# Patient Record
Sex: Female | Born: 1970 | Race: White | Hispanic: No | Marital: Single | Smoking: Never smoker
Health system: Southern US, Community
[De-identification: ages and names within clinical notes are randomized; demographics above are authoritative.]

## PROBLEM LIST (undated history)

## (undated) DIAGNOSIS — J302 Other seasonal allergic rhinitis: Secondary | ICD-10-CM

## (undated) DIAGNOSIS — M1990 Unspecified osteoarthritis, unspecified site: Secondary | ICD-10-CM

## (undated) DIAGNOSIS — E119 Type 2 diabetes mellitus without complications: Secondary | ICD-10-CM

## (undated) HISTORY — PX: CLEFT PALATE REPAIR: SUR1165

## (undated) HISTORY — PX: CLEFT LIP REPAIR: SUR1164

---

## 2009-07-16 ENCOUNTER — Encounter: Admission: RE | Admit: 2009-07-16 | Discharge: 2009-07-16 | Payer: Self-pay | Admitting: Unknown Physician Specialty

## 2010-03-29 ENCOUNTER — Ambulatory Visit
Admission: RE | Admit: 2010-03-29 | Discharge: 2010-03-29 | Disposition: A | Discharge status: Home / Self Care | Payer: MEDICARE | Source: Ambulatory Visit

## 2010-03-29 ENCOUNTER — Other Ambulatory Visit: Payer: Self-pay

## 2010-03-29 ENCOUNTER — Other Ambulatory Visit: Payer: Self-pay | Admitting: Unknown Physician Specialty

## 2010-03-29 DIAGNOSIS — M25561 Pain in right knee: Secondary | ICD-10-CM

## 2010-04-19 ENCOUNTER — Ambulatory Visit: Payer: MEDICARE | Attending: Sports Medicine | Admitting: Physical Therapy

## 2010-04-19 DIAGNOSIS — M25569 Pain in unspecified knee: Secondary | ICD-10-CM | POA: Insufficient documentation

## 2010-04-19 DIAGNOSIS — M6281 Muscle weakness (generalized): Secondary | ICD-10-CM | POA: Insufficient documentation

## 2010-04-19 DIAGNOSIS — IMO0001 Reserved for inherently not codable concepts without codable children: Secondary | ICD-10-CM | POA: Insufficient documentation

## 2010-04-26 ENCOUNTER — Ambulatory Visit: Payer: MEDICARE | Admitting: Physical Therapy

## 2010-05-03 ENCOUNTER — Ambulatory Visit: Payer: MEDICARE | Admitting: Physical Therapy

## 2010-05-20 ENCOUNTER — Ambulatory Visit: Payer: MEDICARE | Attending: Sports Medicine | Admitting: Physical Therapy

## 2010-05-20 DIAGNOSIS — IMO0001 Reserved for inherently not codable concepts without codable children: Secondary | ICD-10-CM | POA: Insufficient documentation

## 2010-05-20 DIAGNOSIS — M6281 Muscle weakness (generalized): Secondary | ICD-10-CM | POA: Insufficient documentation

## 2010-05-20 DIAGNOSIS — M25569 Pain in unspecified knee: Secondary | ICD-10-CM | POA: Insufficient documentation

## 2010-05-25 ENCOUNTER — Ambulatory Visit: Payer: MEDICARE | Admitting: Physical Therapy

## 2010-05-28 ENCOUNTER — Ambulatory Visit: Payer: MEDICARE | Admitting: Physical Therapy

## 2010-06-01 ENCOUNTER — Ambulatory Visit: Payer: MEDICARE | Admitting: Physical Therapy

## 2011-09-19 IMAGING — CR DG ABDOMEN 2V
2 series · 2 of 2 positions shown · non-contrast
Comparison: None

CLINICAL DATA: Abdominal pain.  Question constipation.

ABDOMEN - 2 VIEW

[view not recorded (1 of 2)]
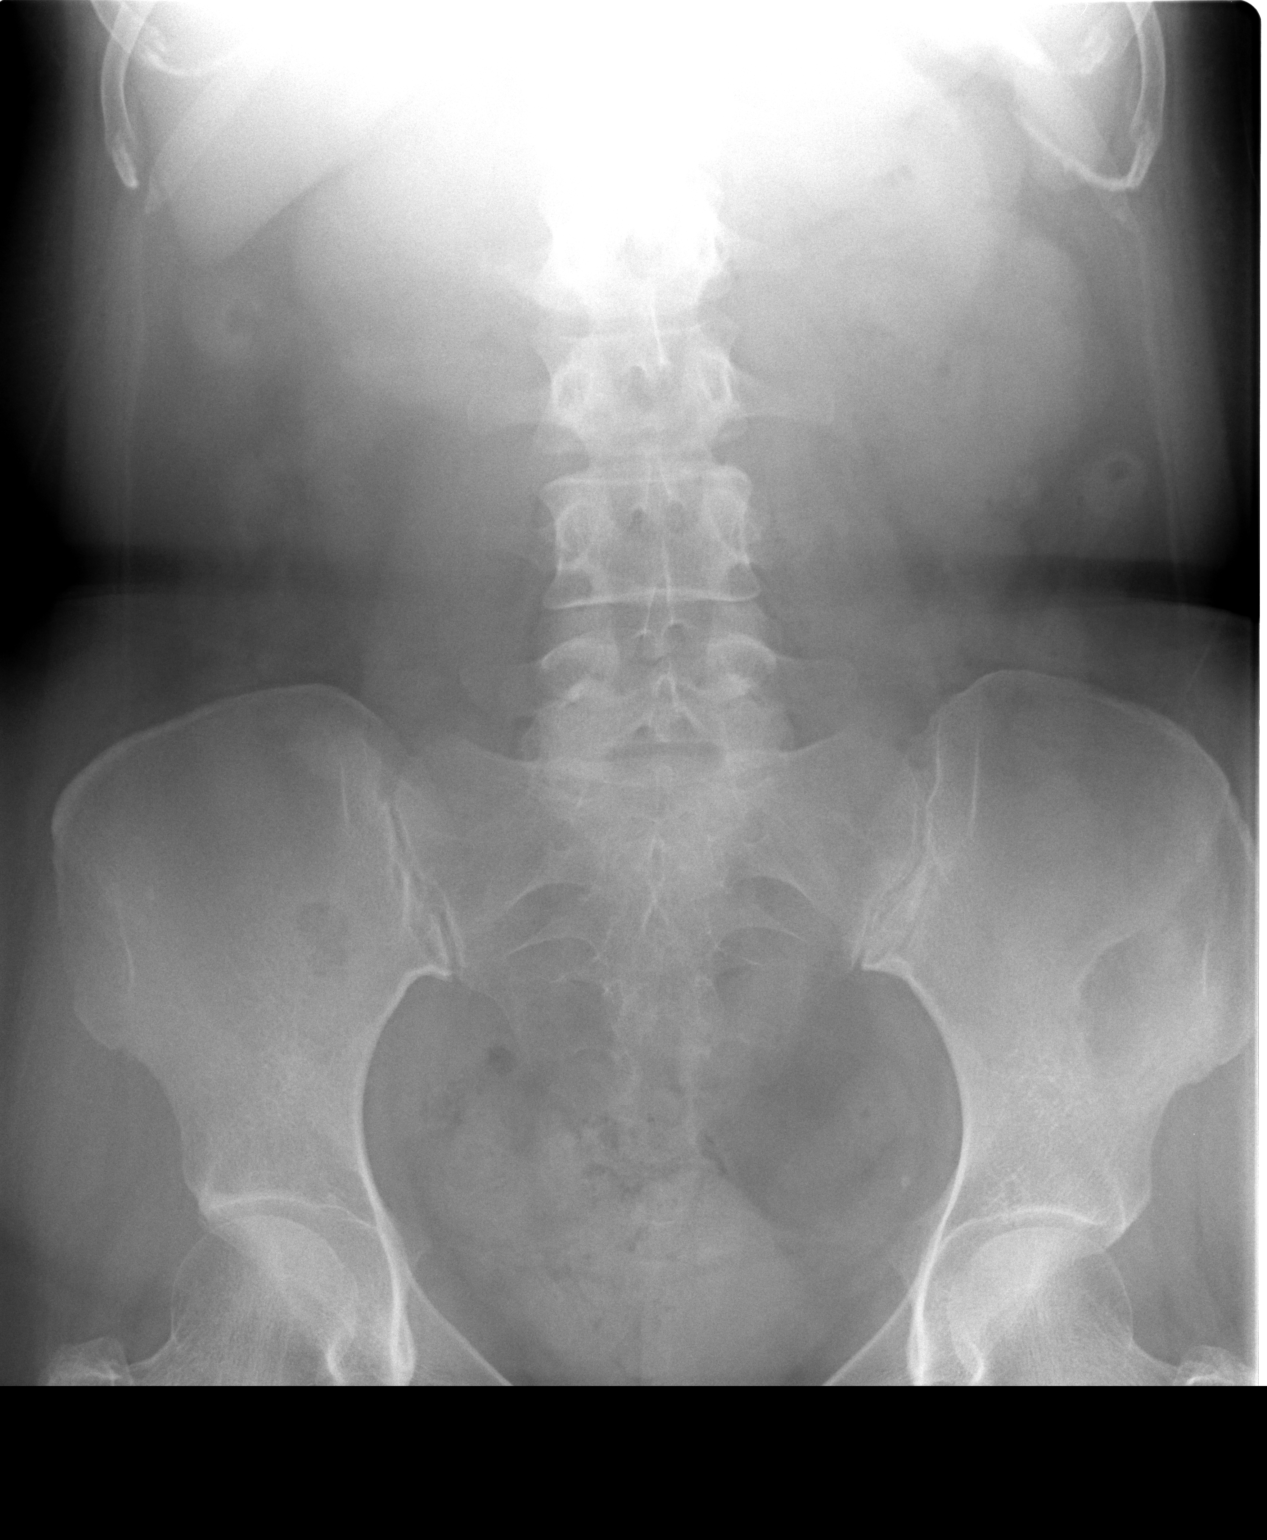

[view not recorded (2 of 2)]
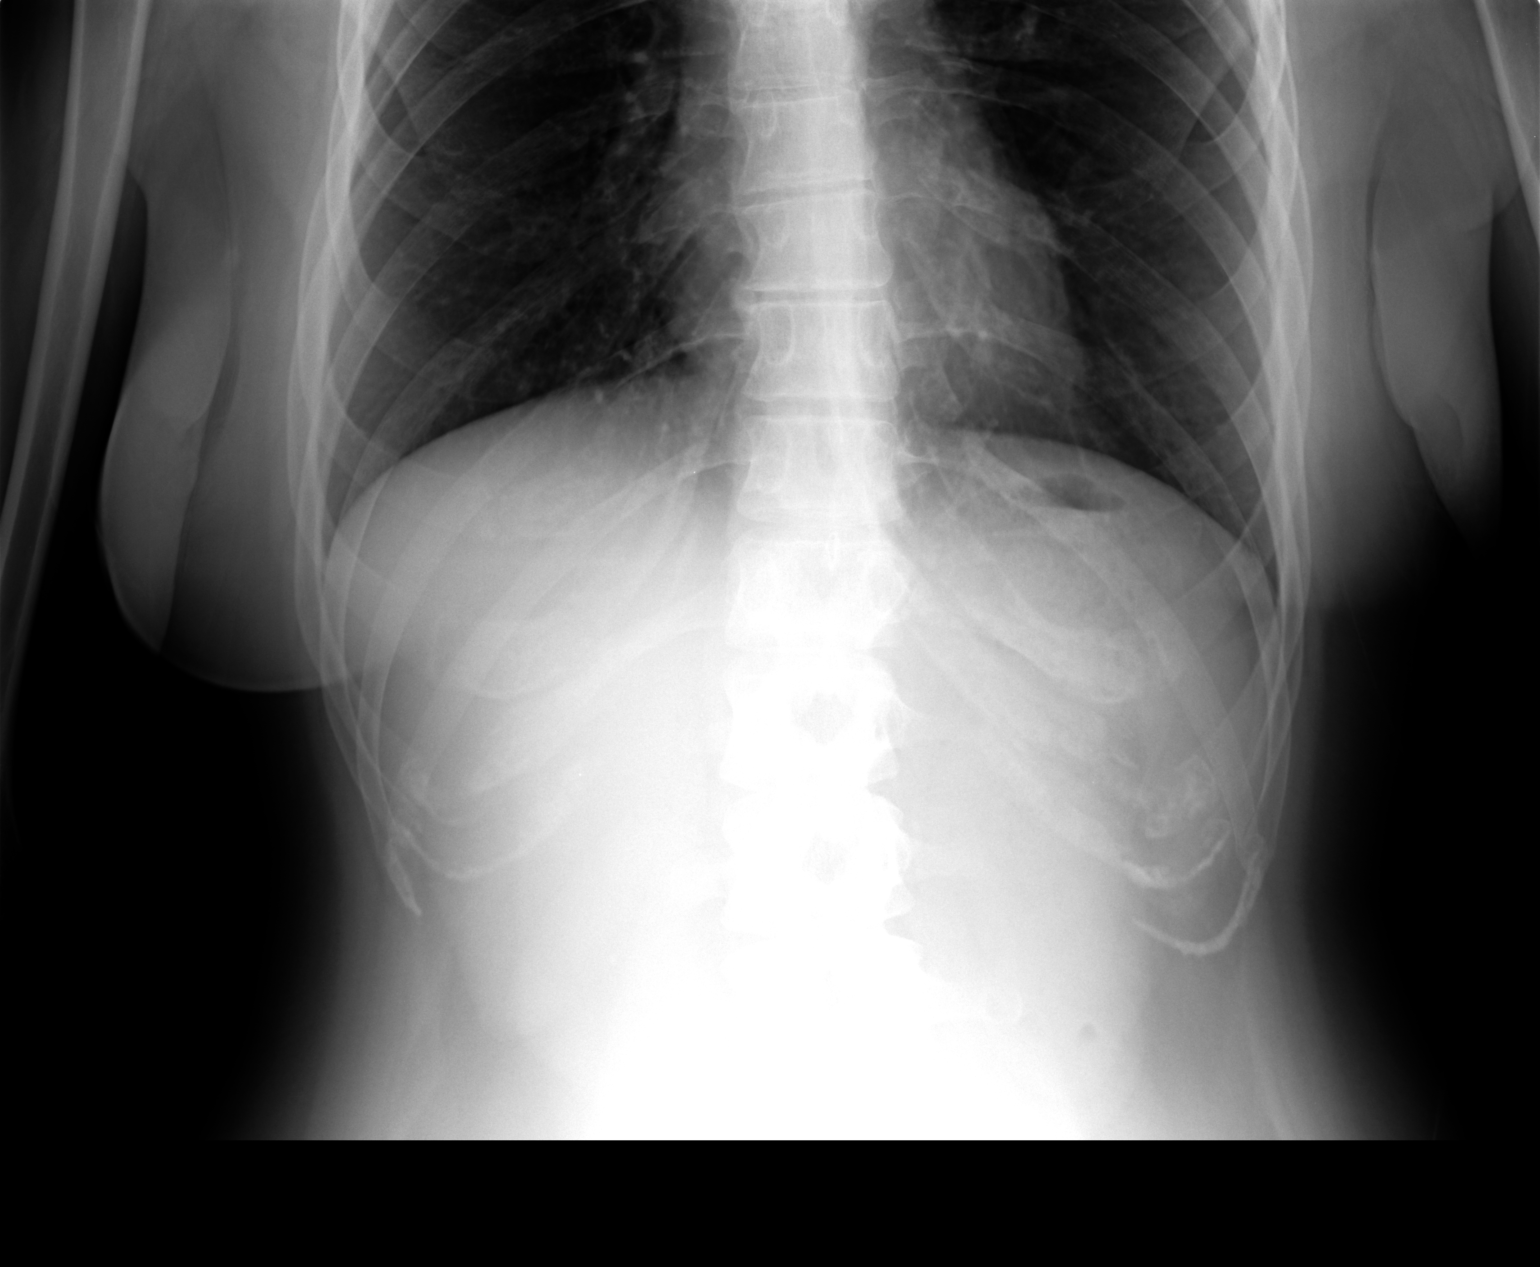

[2 of 2 positions shown; findings below may reference images not displayed]

FINDINGS: There is normal bowel gas pattern.  No free air.  No
organomegaly or suspicious calcification.  No acute bony
abnormality. No radiographic evidence to suggest constipation.
IMPRESSION: Negative study.

## 2012-06-01 IMAGING — CR DG KNEE 1-2V*R*
2 series · 2 of 2 positions shown · non-contrast
Comparison: None.

CLINICAL DATA: Right knee pain for months, no trauma

RIGHT KNEE - 1-2 VIEW

[view not recorded (1 of 2)]
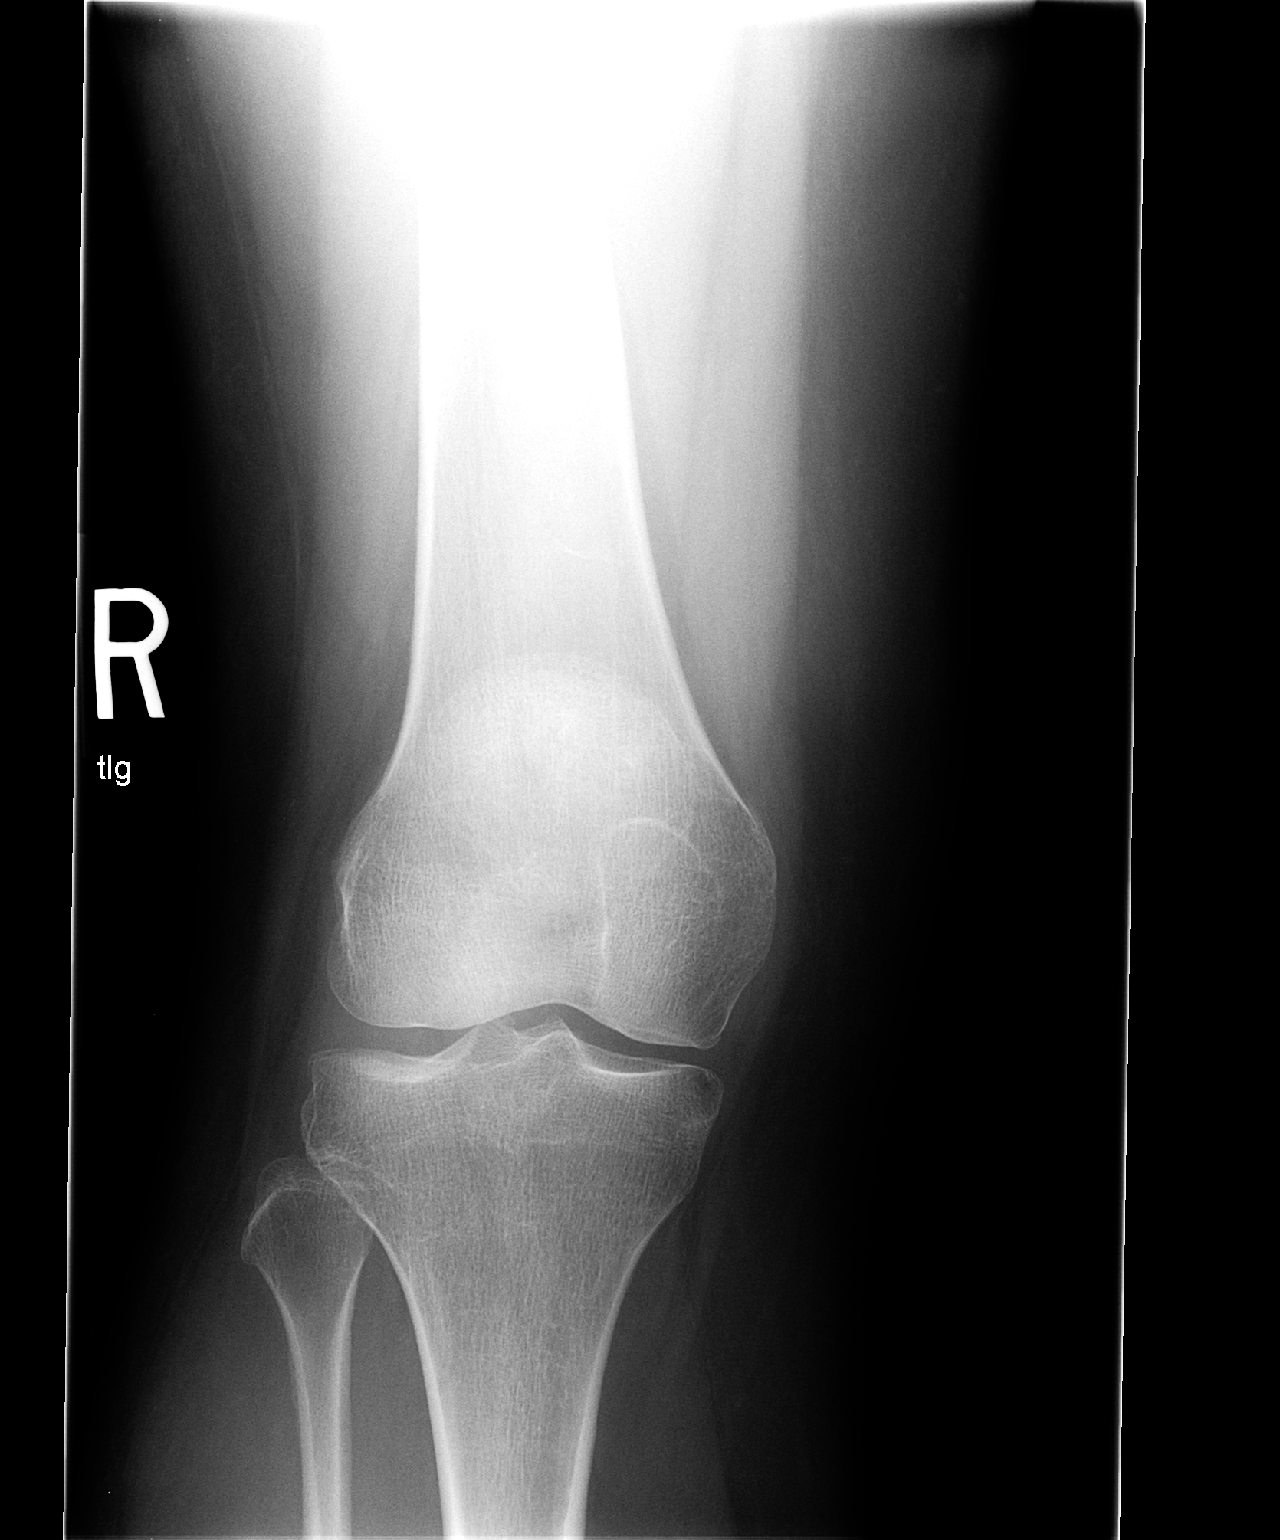

[view not recorded (2 of 2)]
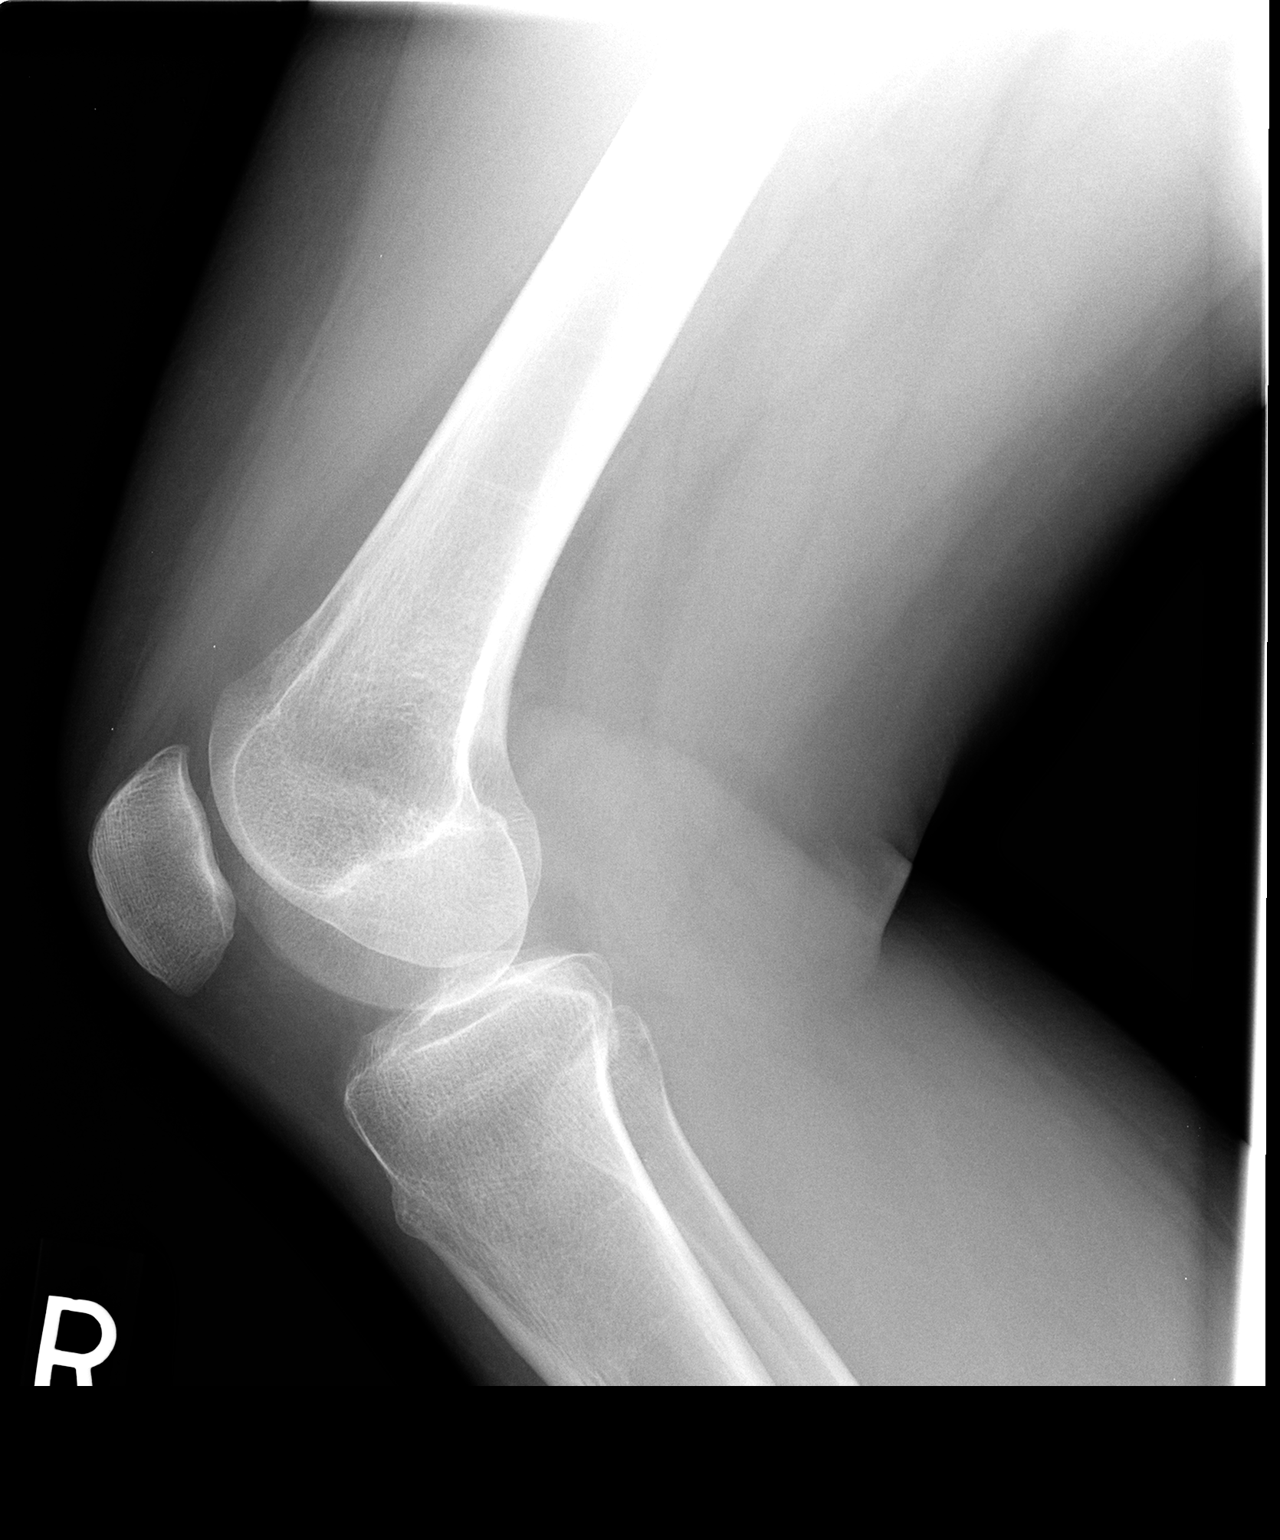

[2 of 2 positions shown; findings below may reference images not displayed]

FINDINGS: The right knee joint spaces appear normal.  No acute bony
abnormality is seen.  No joint effusion is noted.
IMPRESSION: Negative right knee.

## 2013-01-30 ENCOUNTER — Other Ambulatory Visit: Payer: Self-pay | Admitting: Family Medicine

## 2013-01-30 ENCOUNTER — Ambulatory Visit (INDEPENDENT_AMBULATORY_CARE_PROVIDER_SITE_OTHER): Payer: Medicare Other

## 2013-01-30 DIAGNOSIS — R52 Pain, unspecified: Secondary | ICD-10-CM

## 2013-01-30 DIAGNOSIS — M47812 Spondylosis without myelopathy or radiculopathy, cervical region: Secondary | ICD-10-CM

## 2013-10-23 ENCOUNTER — Emergency Department
Admission: EM | Admit: 2013-10-23 | Discharge: 2013-10-23 | Disposition: A | Discharge status: Home / Self Care | Payer: Medicare Other | Source: Home / Self Care | Attending: Emergency Medicine | Admitting: Emergency Medicine

## 2013-10-23 ENCOUNTER — Encounter: Payer: Self-pay | Admitting: Emergency Medicine

## 2013-10-23 DIAGNOSIS — B354 Tinea corporis: Secondary | ICD-10-CM

## 2013-10-23 HISTORY — DX: Unspecified osteoarthritis, unspecified site: M19.90

## 2013-10-23 HISTORY — DX: Type 2 diabetes mellitus without complications: E11.9

## 2013-10-23 HISTORY — DX: Other seasonal allergic rhinitis: J30.2

## 2013-10-23 MED ORDER — CLOTRIMAZOLE 1 % EX CREA: TOPICAL_CREAM | CUTANEOUS | Status: AC

## 2013-10-23 NOTE — ED Notes (Signed)
Pt c/o RT sided facial rash x 1 day.

## 2013-10-23 NOTE — ED Provider Notes (Signed)
CSN: [id number]     Arrival date & time 10/23/13  1809 History   First MD Initiated Contact with Patient 10/23/13 1844     Chief Complaint  Patient presents with  . Rash   (Consider location/radiation/quality/duration/timing/severity/associated sxs/prior Treatment) Patient is a 44 y.o. female presenting with rash. The history is provided by the patient. No language interpreter was used.  Rash Pain location: face. Pain radiates to:  Does not radiate Pain severity:  No pain Timing:  Constant Chronicity:  New Relieved by:  Nothing Worsened by:  Nothing tried Ineffective treatments:  None tried Pt has a round area on right face that is red,  itchy  Past Medical History  Diagnosis Date  . Diabetes mellitus without complication   . Seasonal allergies   . Arthritis    Past Surgical History  Procedure Laterality Date  . Cleft lip repair    . Cleft palate repair     Family History  Problem Relation Age of Onset  . Thyroid disease Mother   . Diabetes Mother   . Rheum arthritis Mother   . Hypertension Mother   . Hyperlipidemia Mother    History  Substance Use Topics  . Smoking status: Never Smoker   . Smokeless tobacco: Not on file  . Alcohol Use: No   OB History   Grav Para Term Preterm Abortions TAB SAB Ect Mult Living                 Review of Systems  Skin: Positive for rash.  All other systems reviewed and are negative.   Allergies  Estrogens  Home Medications   Prior to Admission medications   Medication Sig Start Date End Date Taking? Authorizing Provider  Biotin 1 MG CAPS Take by mouth.   Yes Historical Provider, MD  Calcium Citrate-Vitamin D (CITRACAL + D PO) Take by mouth.   Yes Historical Provider, MD  cetirizine (ZYRTEC) 10 MG tablet Take 10 mg by mouth daily.   Yes Historical Provider, MD  Fish Oil-Cholecalciferol (FISH OIL + D3 PO) Take by mouth.   Yes Historical Provider, MD  glipiZIDE (GLUCOTROL) 5 MG tablet Take by mouth daily before breakfast.    Yes Historical Provider, MD  Lactose POWD by Does not apply route.   Yes Historical Provider, MD  meloxicam (MOBIC) 15 MG tablet Take 15 mg by mouth daily.   Yes Historical Provider, MD  clotrimazole (LOTRIMIN) 1 % cream Apply to affected area 2 times daily 10/23/13   Elson Areas, PA-C   BP 134/82  Pulse 71  Temp(Src) 97.4 F (36.3 C) (Oral)  Resp 16  Ht  (1.702 m)  Wt 170 lb (77.111 kg)  BMI 26.62 kg/m2  SpO2 97%  LMP 10/19/2013 Physical Exam  Nursing note and vitals reviewed. Constitutional: She is oriented to person, place, and time. She appears well-developed and well-nourished.  HENT:  Head: Normocephalic and atraumatic.  Erythematous round area right face clearing center  Eyes: EOM are normal. Pupils are equal, round, and reactive to light.  Neck: Normal range of motion.  Pulmonary/Chest: Effort normal.  Abdominal: She exhibits no distension.  Musculoskeletal: Normal range of motion.  Neurological: She is alert and oriented to person, place, and time.  Psychiatric: She has a normal mood and affect.    ED Course  Procedures (including critical care time) Labs Review Labs Reviewed - No data to display  Imaging Review No results found.   MDM   1. Tinea corporis  lotrimin    Elson Areas, PA-C 10/23/13 [phone]

## 2013-10-23 NOTE — Discharge Instructions (Signed)

## 2013-10-25 NOTE — ED Provider Notes (Signed)
Medical history/examination/treatment/procedure(s) were performed by non-physician provider and as supervising physician I was immediately available for consultation/collaboration.   Lajean Manes, MD 10/25/13 1444

## 2014-02-19 ENCOUNTER — Other Ambulatory Visit: Payer: Self-pay | Admitting: Unknown Physician Specialty

## 2014-02-19 DIAGNOSIS — Z1239 Encounter for other screening for malignant neoplasm of breast: Secondary | ICD-10-CM

## 2014-02-27 ENCOUNTER — Ambulatory Visit (INDEPENDENT_AMBULATORY_CARE_PROVIDER_SITE_OTHER): Payer: Commercial Managed Care - HMO

## 2014-02-27 ENCOUNTER — Other Ambulatory Visit: Payer: Self-pay | Admitting: Family Medicine

## 2014-02-27 DIAGNOSIS — Z1231 Encounter for screening mammogram for malignant neoplasm of breast: Secondary | ICD-10-CM

## 2014-02-27 DIAGNOSIS — Z1239 Encounter for other screening for malignant neoplasm of breast: Secondary | ICD-10-CM

## 2015-01-15 ENCOUNTER — Other Ambulatory Visit: Payer: Self-pay | Admitting: Family Medicine

## 2015-01-15 DIAGNOSIS — Z1231 Encounter for screening mammogram for malignant neoplasm of breast: Secondary | ICD-10-CM

## 2015-03-04 ENCOUNTER — Ambulatory Visit (INDEPENDENT_AMBULATORY_CARE_PROVIDER_SITE_OTHER): Payer: Commercial Managed Care - HMO

## 2015-03-04 DIAGNOSIS — Z1231 Encounter for screening mammogram for malignant neoplasm of breast: Secondary | ICD-10-CM | POA: Diagnosis not present

## 2016-02-26 ENCOUNTER — Other Ambulatory Visit: Payer: Self-pay | Admitting: Unknown Physician Specialty

## 2016-02-26 DIAGNOSIS — Z1231 Encounter for screening mammogram for malignant neoplasm of breast: Secondary | ICD-10-CM

## 2016-03-09 ENCOUNTER — Ambulatory Visit (INDEPENDENT_AMBULATORY_CARE_PROVIDER_SITE_OTHER): Payer: Medicare HMO

## 2016-03-09 DIAGNOSIS — Z1231 Encounter for screening mammogram for malignant neoplasm of breast: Secondary | ICD-10-CM | POA: Diagnosis not present

## 2018-09-05 DIAGNOSIS — H16142 Punctate keratitis, left eye: Secondary | ICD-10-CM | POA: Diagnosis not present

## 2018-09-19 DIAGNOSIS — E1169 Type 2 diabetes mellitus with other specified complication: Secondary | ICD-10-CM | POA: Diagnosis not present

## 2018-09-19 DIAGNOSIS — K219 Gastro-esophageal reflux disease without esophagitis: Secondary | ICD-10-CM | POA: Diagnosis not present

## 2018-09-19 DIAGNOSIS — E7800 Pure hypercholesterolemia, unspecified: Secondary | ICD-10-CM | POA: Diagnosis not present

## 2018-09-19 DIAGNOSIS — M5136 Other intervertebral disc degeneration, lumbar region: Secondary | ICD-10-CM | POA: Diagnosis not present

## 2018-10-25 DIAGNOSIS — H16142 Punctate keratitis, left eye: Secondary | ICD-10-CM | POA: Diagnosis not present

## 2018-10-30 DIAGNOSIS — E1142 Type 2 diabetes mellitus with diabetic polyneuropathy: Secondary | ICD-10-CM | POA: Diagnosis not present

## 2018-10-30 DIAGNOSIS — M722 Plantar fascial fibromatosis: Secondary | ICD-10-CM | POA: Diagnosis not present

## 2018-10-30 DIAGNOSIS — B351 Tinea unguium: Secondary | ICD-10-CM | POA: Diagnosis not present

## 2018-10-30 DIAGNOSIS — M24573 Contracture, unspecified ankle: Secondary | ICD-10-CM | POA: Diagnosis not present

## 2018-12-04 DIAGNOSIS — Z23 Encounter for immunization: Secondary | ICD-10-CM | POA: Diagnosis not present

## 2019-01-15 DIAGNOSIS — M47812 Spondylosis without myelopathy or radiculopathy, cervical region: Secondary | ICD-10-CM | POA: Diagnosis not present

## 2019-01-15 DIAGNOSIS — E1169 Type 2 diabetes mellitus with other specified complication: Secondary | ICD-10-CM | POA: Diagnosis not present

## 2019-01-15 DIAGNOSIS — E7800 Pure hypercholesterolemia, unspecified: Secondary | ICD-10-CM | POA: Diagnosis not present

## 2019-01-15 DIAGNOSIS — R5383 Other fatigue: Secondary | ICD-10-CM | POA: Diagnosis not present

## 2019-01-15 DIAGNOSIS — Z0000 Encounter for general adult medical examination without abnormal findings: Secondary | ICD-10-CM | POA: Diagnosis not present

## 2019-01-29 DIAGNOSIS — R600 Localized edema: Secondary | ICD-10-CM | POA: Diagnosis not present

## 2019-01-29 DIAGNOSIS — E1142 Type 2 diabetes mellitus with diabetic polyneuropathy: Secondary | ICD-10-CM | POA: Diagnosis not present

## 2019-01-29 DIAGNOSIS — B351 Tinea unguium: Secondary | ICD-10-CM | POA: Diagnosis not present

## 2019-01-29 DIAGNOSIS — L600 Ingrowing nail: Secondary | ICD-10-CM | POA: Diagnosis not present

## 2019-01-29 DIAGNOSIS — M24573 Contracture, unspecified ankle: Secondary | ICD-10-CM | POA: Diagnosis not present

## 2019-02-13 DIAGNOSIS — H52209 Unspecified astigmatism, unspecified eye: Secondary | ICD-10-CM | POA: Diagnosis not present

## 2019-02-13 DIAGNOSIS — Z135 Encounter for screening for eye and ear disorders: Secondary | ICD-10-CM | POA: Diagnosis not present

## 2019-02-13 DIAGNOSIS — E119 Type 2 diabetes mellitus without complications: Secondary | ICD-10-CM | POA: Diagnosis not present

## 2019-03-19 DIAGNOSIS — M9903 Segmental and somatic dysfunction of lumbar region: Secondary | ICD-10-CM | POA: Diagnosis not present

## 2019-03-19 DIAGNOSIS — M9902 Segmental and somatic dysfunction of thoracic region: Secondary | ICD-10-CM | POA: Diagnosis not present

## 2019-03-19 DIAGNOSIS — M545 Low back pain, unspecified: Secondary | ICD-10-CM | POA: Diagnosis not present

## 2019-03-19 DIAGNOSIS — M5416 Radiculopathy, lumbar region: Secondary | ICD-10-CM | POA: Diagnosis not present

## 2019-03-19 DIAGNOSIS — M9904 Segmental and somatic dysfunction of sacral region: Secondary | ICD-10-CM | POA: Diagnosis not present

## 2019-03-19 DIAGNOSIS — M542 Cervicalgia: Secondary | ICD-10-CM | POA: Diagnosis not present

## 2019-04-22 DIAGNOSIS — Z1231 Encounter for screening mammogram for malignant neoplasm of breast: Secondary | ICD-10-CM | POA: Diagnosis not present

## 2019-06-10 DIAGNOSIS — M5136 Other intervertebral disc degeneration, lumbar region: Secondary | ICD-10-CM | POA: Diagnosis not present

## 2019-06-10 DIAGNOSIS — E1169 Type 2 diabetes mellitus with other specified complication: Secondary | ICD-10-CM | POA: Diagnosis not present

## 2019-06-10 DIAGNOSIS — K219 Gastro-esophageal reflux disease without esophagitis: Secondary | ICD-10-CM | POA: Diagnosis not present

## 2019-06-10 DIAGNOSIS — E7800 Pure hypercholesterolemia, unspecified: Secondary | ICD-10-CM | POA: Diagnosis not present

## 2019-08-13 DIAGNOSIS — M7751 Other enthesopathy of right foot: Secondary | ICD-10-CM | POA: Diagnosis not present

## 2019-08-13 DIAGNOSIS — B351 Tinea unguium: Secondary | ICD-10-CM | POA: Diagnosis not present

## 2019-08-13 DIAGNOSIS — M216X1 Other acquired deformities of right foot: Secondary | ICD-10-CM | POA: Diagnosis not present

## 2019-08-13 DIAGNOSIS — E1142 Type 2 diabetes mellitus with diabetic polyneuropathy: Secondary | ICD-10-CM | POA: Diagnosis not present

## 2019-08-13 DIAGNOSIS — M24573 Contracture, unspecified ankle: Secondary | ICD-10-CM | POA: Diagnosis not present

## 2019-08-13 DIAGNOSIS — M216X2 Other acquired deformities of left foot: Secondary | ICD-10-CM | POA: Diagnosis not present

## 2019-08-13 DIAGNOSIS — M7752 Other enthesopathy of left foot: Secondary | ICD-10-CM | POA: Diagnosis not present

## 2019-10-09 DIAGNOSIS — L309 Dermatitis, unspecified: Secondary | ICD-10-CM | POA: Diagnosis not present

## 2019-10-09 DIAGNOSIS — E1169 Type 2 diabetes mellitus with other specified complication: Secondary | ICD-10-CM | POA: Diagnosis not present

## 2019-10-09 DIAGNOSIS — E7800 Pure hypercholesterolemia, unspecified: Secondary | ICD-10-CM | POA: Diagnosis not present

## 2019-10-09 DIAGNOSIS — M5136 Other intervertebral disc degeneration, lumbar region: Secondary | ICD-10-CM | POA: Diagnosis not present

## 2019-11-09 DIAGNOSIS — Z20822 Contact with and (suspected) exposure to covid-19: Secondary | ICD-10-CM | POA: Diagnosis not present

## 2019-11-12 DIAGNOSIS — Z23 Encounter for immunization: Secondary | ICD-10-CM | POA: Diagnosis not present

## 2019-11-13 DIAGNOSIS — R600 Localized edema: Secondary | ICD-10-CM | POA: Diagnosis not present

## 2019-11-13 DIAGNOSIS — L600 Ingrowing nail: Secondary | ICD-10-CM | POA: Diagnosis not present
